# Patient Record
Sex: Female | Born: 1950
Health system: Southern US, Community
[De-identification: ages and names within clinical notes are randomized; demographics above are authoritative.]

## PROBLEM LIST (undated history)

## (undated) DIAGNOSIS — K219 Gastro-esophageal reflux disease without esophagitis: Secondary | ICD-10-CM

## (undated) DIAGNOSIS — K2100 Gastro-esophageal reflux disease with esophagitis, without bleeding: Secondary | ICD-10-CM

## (undated) DIAGNOSIS — M5416 Radiculopathy, lumbar region: Secondary | ICD-10-CM

## (undated) DIAGNOSIS — E785 Hyperlipidemia, unspecified: Secondary | ICD-10-CM

## (undated) DIAGNOSIS — R51 Headache: Secondary | ICD-10-CM

## (undated) DIAGNOSIS — R519 Headache, unspecified: Secondary | ICD-10-CM

## (undated) DIAGNOSIS — G5711 Meralgia paresthetica, right lower limb: Secondary | ICD-10-CM

## (undated) DIAGNOSIS — G250 Essential tremor: Secondary | ICD-10-CM

## (undated) HISTORY — PX: LIVER BIOPSY: SHX301

## (undated) HISTORY — PX: CHOLECYSTECTOMY: SHX55

## (undated) HISTORY — PX: ABDOMINAL HYSTERECTOMY: SHX81

---

## 1995-05-27 HISTORY — PX: BREAST BIOPSY: SHX20

## 2004-03-04 ENCOUNTER — Ambulatory Visit: Payer: Self-pay | Admitting: Internal Medicine

## 2004-07-25 ENCOUNTER — Ambulatory Visit: Payer: Self-pay | Admitting: Internal Medicine

## 2004-07-26 ENCOUNTER — Ambulatory Visit: Payer: Self-pay | Admitting: Internal Medicine

## 2004-08-05 ENCOUNTER — Ambulatory Visit: Payer: Self-pay | Admitting: Surgery

## 2005-03-25 ENCOUNTER — Ambulatory Visit: Payer: Self-pay | Admitting: Internal Medicine

## 2005-09-29 ENCOUNTER — Observation Stay: Payer: Self-pay | Admitting: Internal Medicine

## 2006-03-27 ENCOUNTER — Ambulatory Visit: Payer: Self-pay | Admitting: Internal Medicine

## 2007-05-26 ENCOUNTER — Ambulatory Visit: Payer: Self-pay | Admitting: Internal Medicine

## 2008-05-29 ENCOUNTER — Ambulatory Visit: Payer: Self-pay | Admitting: Internal Medicine

## 2009-06-07 ENCOUNTER — Ambulatory Visit: Payer: Self-pay | Admitting: Internal Medicine

## 2010-06-11 ENCOUNTER — Ambulatory Visit: Payer: Self-pay | Admitting: Internal Medicine

## 2011-06-17 ENCOUNTER — Ambulatory Visit: Payer: Self-pay | Admitting: Internal Medicine

## 2011-07-07 ENCOUNTER — Ambulatory Visit: Payer: Self-pay | Admitting: Gastroenterology

## 2012-06-17 ENCOUNTER — Ambulatory Visit: Payer: Self-pay | Admitting: Internal Medicine

## 2013-06-21 ENCOUNTER — Ambulatory Visit: Payer: Self-pay | Admitting: Internal Medicine

## 2014-07-26 ENCOUNTER — Ambulatory Visit
Admit: 2014-07-26 | Disposition: A | Discharge status: Home / Self Care | Payer: Self-pay | Attending: Internal Medicine | Admitting: Internal Medicine

## 2014-07-27 ENCOUNTER — Ambulatory Visit: Payer: Self-pay | Admitting: Internal Medicine

## 2014-09-05 ENCOUNTER — Ambulatory Visit
Admit: 2014-09-05 | Disposition: A | Discharge status: Home / Self Care | Payer: Self-pay | Attending: Physical Medicine and Rehabilitation | Admitting: Physical Medicine and Rehabilitation

## 2015-08-03 ENCOUNTER — Other Ambulatory Visit: Payer: Self-pay | Admitting: Internal Medicine

## 2015-08-03 DIAGNOSIS — Z1231 Encounter for screening mammogram for malignant neoplasm of breast: Secondary | ICD-10-CM

## 2015-08-14 ENCOUNTER — Ambulatory Visit
Admission: RE | Admit: 2015-08-14 | Discharge: 2015-08-14 | Disposition: A | Discharge status: Home / Self Care | Payer: BLUE CROSS/BLUE SHIELD | Source: Ambulatory Visit | Attending: Internal Medicine | Admitting: Internal Medicine

## 2015-08-14 DIAGNOSIS — Z1231 Encounter for screening mammogram for malignant neoplasm of breast: Secondary | ICD-10-CM

## 2016-07-17 DIAGNOSIS — Z79899 Other long term (current) drug therapy: Secondary | ICD-10-CM | POA: Diagnosis not present

## 2016-07-17 DIAGNOSIS — M5416 Radiculopathy, lumbar region: Secondary | ICD-10-CM | POA: Diagnosis not present

## 2016-07-24 DIAGNOSIS — M7061 Trochanteric bursitis, right hip: Secondary | ICD-10-CM | POA: Diagnosis not present

## 2016-07-24 DIAGNOSIS — Z0000 Encounter for general adult medical examination without abnormal findings: Secondary | ICD-10-CM | POA: Diagnosis not present

## 2016-08-05 ENCOUNTER — Other Ambulatory Visit: Payer: Self-pay | Admitting: Internal Medicine

## 2016-08-05 DIAGNOSIS — Z1231 Encounter for screening mammogram for malignant neoplasm of breast: Secondary | ICD-10-CM

## 2016-09-02 ENCOUNTER — Other Ambulatory Visit: Payer: Self-pay | Admitting: Internal Medicine

## 2016-09-02 ENCOUNTER — Ambulatory Visit
Admission: RE | Admit: 2016-09-02 | Discharge: 2016-09-02 | Disposition: A | Discharge status: Home / Self Care | Payer: PPO | Source: Ambulatory Visit | Attending: Internal Medicine | Admitting: Internal Medicine

## 2016-09-02 DIAGNOSIS — Z1231 Encounter for screening mammogram for malignant neoplasm of breast: Secondary | ICD-10-CM | POA: Diagnosis not present

## 2016-09-18 DIAGNOSIS — Z8371 Family history of colonic polyps: Secondary | ICD-10-CM | POA: Diagnosis not present

## 2016-09-18 DIAGNOSIS — K219 Gastro-esophageal reflux disease without esophagitis: Secondary | ICD-10-CM | POA: Diagnosis not present

## 2016-11-11 DIAGNOSIS — M5416 Radiculopathy, lumbar region: Secondary | ICD-10-CM | POA: Diagnosis not present

## 2016-11-11 DIAGNOSIS — M5136 Other intervertebral disc degeneration, lumbar region: Secondary | ICD-10-CM | POA: Diagnosis not present

## 2016-12-09 DIAGNOSIS — H25813 Combined forms of age-related cataract, bilateral: Secondary | ICD-10-CM | POA: Diagnosis not present

## 2016-12-12 DIAGNOSIS — L821 Other seborrheic keratosis: Secondary | ICD-10-CM | POA: Diagnosis not present

## 2016-12-26 DIAGNOSIS — M5416 Radiculopathy, lumbar region: Secondary | ICD-10-CM | POA: Diagnosis not present

## 2016-12-26 DIAGNOSIS — M5136 Other intervertebral disc degeneration, lumbar region: Secondary | ICD-10-CM | POA: Diagnosis not present

## 2017-01-08 DIAGNOSIS — L821 Other seborrheic keratosis: Secondary | ICD-10-CM | POA: Diagnosis not present

## 2017-01-16 DIAGNOSIS — H02839 Dermatochalasis of unspecified eye, unspecified eyelid: Secondary | ICD-10-CM | POA: Diagnosis not present

## 2017-01-16 DIAGNOSIS — H2513 Age-related nuclear cataract, bilateral: Secondary | ICD-10-CM | POA: Diagnosis not present

## 2017-01-16 DIAGNOSIS — H18413 Arcus senilis, bilateral: Secondary | ICD-10-CM | POA: Diagnosis not present

## 2017-02-11 DIAGNOSIS — H2511 Age-related nuclear cataract, right eye: Secondary | ICD-10-CM | POA: Diagnosis not present

## 2017-02-11 DIAGNOSIS — H2513 Age-related nuclear cataract, bilateral: Secondary | ICD-10-CM | POA: Diagnosis not present

## 2017-02-17 ENCOUNTER — Encounter: Payer: Self-pay | Admitting: *Deleted

## 2017-02-17 DIAGNOSIS — Z23 Encounter for immunization: Secondary | ICD-10-CM | POA: Diagnosis not present

## 2017-02-17 DIAGNOSIS — Z0000 Encounter for general adult medical examination without abnormal findings: Secondary | ICD-10-CM | POA: Diagnosis not present

## 2017-02-18 ENCOUNTER — Ambulatory Visit: Payer: PPO | Admitting: Anesthesiology

## 2017-02-18 ENCOUNTER — Encounter
Admission: RE | Disposition: A | Discharge status: Home / Self Care | Payer: Self-pay | Source: Ambulatory Visit | Attending: Internal Medicine

## 2017-02-18 ENCOUNTER — Ambulatory Visit
Admission: RE | Admit: 2017-02-18 | Discharge: 2017-02-18 | Disposition: A | Discharge status: Home / Self Care | Payer: PPO | Source: Ambulatory Visit | Attending: Internal Medicine | Admitting: Internal Medicine

## 2017-02-18 DIAGNOSIS — G250 Essential tremor: Secondary | ICD-10-CM | POA: Insufficient documentation

## 2017-02-18 DIAGNOSIS — Z8371 Family history of colonic polyps: Secondary | ICD-10-CM | POA: Diagnosis not present

## 2017-02-18 DIAGNOSIS — E785 Hyperlipidemia, unspecified: Secondary | ICD-10-CM | POA: Diagnosis not present

## 2017-02-18 DIAGNOSIS — K219 Gastro-esophageal reflux disease without esophagitis: Secondary | ICD-10-CM | POA: Diagnosis not present

## 2017-02-18 DIAGNOSIS — K648 Other hemorrhoids: Secondary | ICD-10-CM | POA: Diagnosis not present

## 2017-02-18 DIAGNOSIS — K640 First degree hemorrhoids: Secondary | ICD-10-CM | POA: Diagnosis not present

## 2017-02-18 DIAGNOSIS — Z1211 Encounter for screening for malignant neoplasm of colon: Secondary | ICD-10-CM | POA: Diagnosis not present

## 2017-02-18 DIAGNOSIS — Z79899 Other long term (current) drug therapy: Secondary | ICD-10-CM | POA: Insufficient documentation

## 2017-02-18 HISTORY — DX: Gastro-esophageal reflux disease without esophagitis: K21.9

## 2017-02-18 HISTORY — DX: Headache: R51

## 2017-02-18 HISTORY — PX: COLONOSCOPY WITH PROPOFOL: SHX5780

## 2017-02-18 HISTORY — DX: Headache, unspecified: R51.9

## 2017-02-18 HISTORY — DX: Hyperlipidemia, unspecified: E78.5

## 2017-02-18 HISTORY — DX: Gastro-esophageal reflux disease with esophagitis, without bleeding: K21.00

## 2017-02-18 HISTORY — DX: Radiculopathy, lumbar region: M54.16

## 2017-02-18 HISTORY — DX: Essential tremor: G25.0

## 2017-02-18 HISTORY — DX: Gastro-esophageal reflux disease with esophagitis: K21.0

## 2017-02-18 HISTORY — DX: Meralgia paresthetica, right lower limb: G57.11

## 2017-02-18 SURGERY — COLONOSCOPY WITH PROPOFOL
Anesthesia: General

## 2017-02-18 MED ORDER — MIDAZOLAM HCL 2 MG/2ML IJ SOLN
INTRAMUSCULAR | Status: AC
  Filled 2017-02-18: qty 2

## 2017-02-18 MED ORDER — FENTANYL CITRATE (PF) 100 MCG/2ML IJ SOLN
INTRAMUSCULAR | Status: AC
  Filled 2017-02-18: qty 2

## 2017-02-18 MED ORDER — FENTANYL CITRATE (PF) 100 MCG/2ML IJ SOLN
INTRAMUSCULAR | Status: DC | PRN
  Administered 2017-02-18 (×2): 25 ug via INTRAVENOUS
  Administered 2017-02-18: 50 ug via INTRAVENOUS

## 2017-02-18 MED ORDER — SODIUM CHLORIDE 0.9 % IV SOLN
INTRAVENOUS | Status: DC
  Administered 2017-02-18: 10:00:00 via INTRAVENOUS

## 2017-02-18 MED ORDER — MIDAZOLAM HCL 2 MG/2ML IJ SOLN
INTRAMUSCULAR | Status: DC | PRN
  Administered 2017-02-18 (×2): 1 mg via INTRAVENOUS

## 2017-02-18 MED ORDER — EPHEDRINE SULFATE 50 MG/ML IJ SOLN
INTRAMUSCULAR | Status: DC | PRN
  Administered 2017-02-18: 10 mg via INTRAVENOUS
  Administered 2017-02-18 (×2): 5 mg via INTRAVENOUS

## 2017-02-18 MED ORDER — PROPOFOL 500 MG/50ML IV EMUL
INTRAVENOUS | Status: AC
  Filled 2017-02-18: qty 50

## 2017-02-18 MED ORDER — EPHEDRINE SULFATE 50 MG/ML IJ SOLN
INTRAMUSCULAR | Status: AC
  Filled 2017-02-18: qty 1

## 2017-02-18 MED ORDER — PROPOFOL 500 MG/50ML IV EMUL
INTRAVENOUS | Status: DC | PRN
  Administered 2017-02-18: 120 ug/kg/min via INTRAVENOUS

## 2017-02-18 NOTE — Anesthesia Preprocedure Evaluation (Signed)
Anesthesia Evaluation  Patient identified by MRN, date of birth, ID band Patient awake    Reviewed: Allergy & Precautions, NPO status , Patient's Chart, lab work & pertinent test results  History of Anesthesia Complications Negative for: history of anesthetic complications  Airway Mallampati: II       Dental   Pulmonary neg sleep apnea, neg COPD,           Cardiovascular (-) hypertension(-) Past MI and (-) CHF (-) dysrhythmias (-) Valvular Problems/Murmurs     Neuro/Psych Essential tremors    GI/Hepatic Neg liver ROS, GERD  ,  Endo/Other  neg diabetes  Renal/GU negative Renal ROS     Musculoskeletal   Abdominal   Peds  Hematology   Anesthesia Other Findings   Reproductive/Obstetrics                             Anesthesia Physical Anesthesia Plan  ASA: II  Anesthesia Plan: General   Post-op Pain Management:    Induction:   PONV Risk Score and Plan: Propofol infusion  Airway Management Planned:   Additional Equipment:   Intra-op Plan:   Post-operative Plan:   Informed Consent: I have reviewed the patients History and Physical, chart, labs and discussed the procedure including the risks, benefits and alternatives for the proposed anesthesia with the patient or authorized representative who has indicated his/her understanding and acceptance.     Plan Discussed with:   Anesthesia Plan Comments:         Anesthesia Quick Evaluation

## 2017-02-18 NOTE — Transfer of Care (Signed)
Immediate Anesthesia Transfer of Care Note  Patient: Mary Hudson  Procedure(s) Performed: Procedure(s): COLONOSCOPY WITH PROPOFOL (N/A)  Patient Location: PACU  Anesthesia Type:General  Level of Consciousness: awake and sedated  Airway & Oxygen Therapy: Patient Spontanous Breathing and Patient connected to nasal cannula oxygen  Post-op Assessment: Report given to RN and Post -op Vital signs reviewed and stable  Post vital signs: Reviewed and stable  Last Vitals:  Vitals:   02/18/17 0919  BP: 118/72  Pulse: 78  Resp: 20  Temp: (!) 35.7 C  SpO2: 100%    Last Pain:  Vitals:   02/18/17 0919  TempSrc: Tympanic         Complications: No apparent anesthesia complications

## 2017-02-18 NOTE — Anesthesia Post-op Follow-up Note (Signed)
Anesthesia QCDR form completed.        

## 2017-02-18 NOTE — Op Note (Signed)
[facility] Gastroenterology Patient Name: Mary Hudson Procedure Date: 02/18/2017 9:49 AM MRN: [id number] Account #: [id number] Date of Birth: 05/29/1950 Admit Type: Outpatient Age: 66 Room: [facility] ENDO ROOM 1 Gender: Female Note Status: Finalized Procedure:            Colonoscopy Indications:          Colon cancer screening in patient at increased risk:                        Family history of 1st-degree relative with colon polyps Providers:            Benay Pike. Alice Reichert MD, MD Referring MD:         Rusty Aus, MD (Referring MD) Medicines:            Propofol per Anesthesia Complications:        No immediate complications. Procedure:            Pre-Anesthesia Assessment:                       - ASA Grade Assessment: III - A patient with severe                        systemic disease.                       - After reviewing the risks and benefits, the patient                        was deemed in satisfactory condition to undergo the                        procedure.                       After obtaining informed consent, the colonoscope was                        passed under direct vision. Throughout the procedure,                        the patient's blood pressure, pulse, and oxygen                        saturations were monitored continuously. The Olympus                        CF-H180AL colonoscope ( S#: [id number] ) was introduced                        through the anus and advanced to the the cecum,                        identified by appendiceal orifice and ileocecal valve.                        The ileocecal valve, appendiceal orifice, and rectum                        were photographed. The entire colon was well  visualized. The colonoscopy was somewhat difficult due                        to a tortuous colon. Successful completion of the                        procedure was aided by applying abdominal pressure. The             patient tolerated the procedure well. The quality of                        the bowel preparation was good. Findings:      The perianal and digital rectal examinations were normal.      Normal mucosa was found in the entire colon.      Non-bleeding internal hemorrhoids were found during retroflexion. The       hemorrhoids were Grade I (internal hemorrhoids that do not prolapse). Impression:           - Normal mucosa in the entire examined colon.                       - Non-bleeding internal hemorrhoids.                       - No specimens collected. Recommendation:       - Repeat colonoscopy in 5 years for screening purposes.                       - Return to GI office PRN.                       - Patient has a contact number available for                        emergencies. The signs and symptoms of potential                        delayed complications were discussed with the patient.                        Return to normal activities tomorrow. Written discharge                        instructions were provided to the patient.                       - Resume previous diet.                       - Continue present medications.                       - Discharge patient to home (with spouse).                       - The findings and recommendations were discussed with                        the patient.                       - The findings and recommendations  were discussed with                        the patient's family. Procedure Code(s):    --- Professional ---                       L9767, Colorectal cancer screening; colonoscopy on                        individual at high risk Diagnosis Code(s):    --- Professional ---                       K64.0, First degree hemorrhoids                       Z83.71, Family history of colonic polyps CPT copyright 2016 American Medical Association. All rights reserved. The codes documented in this report are preliminary and upon coder  review may  be revised to meet current compliance requirements. Efrain Sella MD, MD 02/18/2017 10:32:34 AM This report has been signed electronically. Number of Addenda: 0 Note Initiated On: 02/18/2017 9:49 AM Scope Withdrawal Time: 0 hours 8 minutes 13 seconds  Total Procedure Duration: 0 hours 19 minutes 33 seconds       [facility]

## 2017-02-18 NOTE — Anesthesia Procedure Notes (Signed)
Performed by: COOK-MARTIN, Akyra Bouchie Pre-anesthesia Checklist: Patient identified, Emergency Drugs available, Suction available, Patient being monitored and Timeout performed Patient Re-evaluated:Patient Re-evaluated prior to induction Oxygen Delivery Method: Nasal cannula Preoxygenation: Pre-oxygenation with 100% oxygen Induction Type: IV induction Placement Confirmation: positive ETCO2 and CO2 detector       

## 2017-02-18 NOTE — H&P (Signed)
Outpatient short stay form Pre-procedure 02/18/2017 10:02 AM Mary Hudson K. Alice Reichert, M.D.  Primary Physician: Jillyn Ledger, M.D.  Reason for visit:  Colon cancer screening, high risk. Last colonoscopy 07/07/2011 - Redundant colon, colonic diverticulosis  History of present illness:  Patient is a pleasant 66 yo wf presenting for colon cancer screening. She denies abdominal pain, weight loss or rectal bleeding.     Current Facility-Administered Medications:  .  0.9 %  sodium chloride infusion, , Intravenous, Continuous, [place], Benay Pike, MD, Last Rate: 20 mL/hr at 02/18/17 5102  Prescriptions Prior to Admission  Medication Sig Dispense Refill Last Dose  . citalopram (CELEXA) 20 MG tablet Take 20 mg by mouth daily.   02/17/2017 at Unknown time  . etodolac (LODINE) 400 MG tablet Take 400 mg by mouth 2 (two) times daily. As needed   Past Month at Unknown time  . fluticasone (FLONASE) 50 MCG/ACT nasal spray Place 2 sprays into both nostrils daily.   02/17/2017 at Unknown time  . propranolol (INDERAL) 20 MG tablet Take 20 mg by mouth 2 (two) times daily.   02/17/2017 at Unknown time  . sucralfate (CARAFATE) 1 g tablet Take 1 g by mouth 4 (four) times daily.   Past Month at Unknown time  . topiramate (TOPAMAX) 100 MG tablet Take 100 mg by mouth daily.   Past Month at Unknown time     Allergies  Allergen Reactions  . Celebrex [Celecoxib]   . Flagyl [Metronidazole]   . Mirtazapine   . Sulfa Antibiotics      Past Medical History:  Diagnosis Date  . Chronic reflux esophagitis   . Essential tremor   . GERD (gastroesophageal reflux disease)   . Headache    Migraine with aura and without status migrainosus, not intractable  . Hyperlipidemia   . Lumbar radiculitis   . Meralgia paraesthetica, right     Review of systems:      Physical Exam  Gen: NAD. Appears comfortable.  HEENT: [place]/AT. PERRLA. Normal external ear exam.  Chest: CTA, no wheezes.  CV: RR nl S1, S2. No gallops.  Abd:  soft, nt, nd. BS+  Ext: no edema. Pulses 2+  Neuro: Alert and oriented. Judgement appears normal. Nonfocal.    Planned procedures: Proceed with colonoscopy. The patient understands the nature of the planned procedure, indications, risks, alternatives and potential complications including but not limited to bleeding, infection, perforation, damage to internal organs and possible oversedation/side effects from anesthesia. The patient agrees and gives consent to proceed.  Please refer to procedure notes for findings, recommendations and patient disposition/instructions.    Mary Hudson K. Alice Reichert, M.D. Gastroenterology 02/18/2017  10:02 AM

## 2017-02-18 NOTE — Anesthesia Postprocedure Evaluation (Signed)
Anesthesia Post Note  Patient: Mary Hudson  Procedure(s) Performed: Procedure(s) (LRB): COLONOSCOPY WITH PROPOFOL (N/A)  Patient location during evaluation: Endoscopy Anesthesia Type: General Level of consciousness: awake and alert Pain management: pain level controlled Vital Signs Assessment: post-procedure vital signs reviewed and stable Respiratory status: spontaneous breathing and respiratory function stable Cardiovascular status: stable Anesthetic complications: no     Last Vitals:  Vitals:   02/18/17 1051 02/18/17 1101  BP: 103/70   Pulse: 74 70  Resp: 19 (!) 22  Temp:    SpO2: 96% 96%    Last Pain:  Vitals:   02/18/17 1031  TempSrc: Tympanic                 Rashidah [place] K

## 2017-02-19 ENCOUNTER — Encounter: Payer: Self-pay | Admitting: Internal Medicine

## 2017-02-24 DIAGNOSIS — M5416 Radiculopathy, lumbar region: Secondary | ICD-10-CM | POA: Diagnosis not present

## 2017-02-24 DIAGNOSIS — M5136 Other intervertebral disc degeneration, lumbar region: Secondary | ICD-10-CM | POA: Diagnosis not present

## 2017-03-16 DIAGNOSIS — H2511 Age-related nuclear cataract, right eye: Secondary | ICD-10-CM | POA: Diagnosis not present

## 2017-03-17 DIAGNOSIS — H2512 Age-related nuclear cataract, left eye: Secondary | ICD-10-CM | POA: Diagnosis not present

## 2017-03-30 DIAGNOSIS — M5416 Radiculopathy, lumbar region: Secondary | ICD-10-CM | POA: Diagnosis not present

## 2017-03-30 DIAGNOSIS — M5136 Other intervertebral disc degeneration, lumbar region: Secondary | ICD-10-CM | POA: Diagnosis not present

## 2017-04-06 DIAGNOSIS — H2512 Age-related nuclear cataract, left eye: Secondary | ICD-10-CM | POA: Diagnosis not present

## 2017-04-13 DIAGNOSIS — Z961 Presence of intraocular lens: Secondary | ICD-10-CM | POA: Diagnosis not present

## 2017-05-01 IMAGING — MG MM DIGITAL SCREENING BILAT W/ TOMO W/ CAD
8 of 13 series · 8 of 29 positions shown · non-contrast
Comparison: Previous exam(s).

CLINICAL DATA: Screening.

EXAM:
2D DIGITAL SCREENING BILATERAL MAMMOGRAM WITH CAD AND ADJUNCT TOMO

[L MLO (1 of 2)]
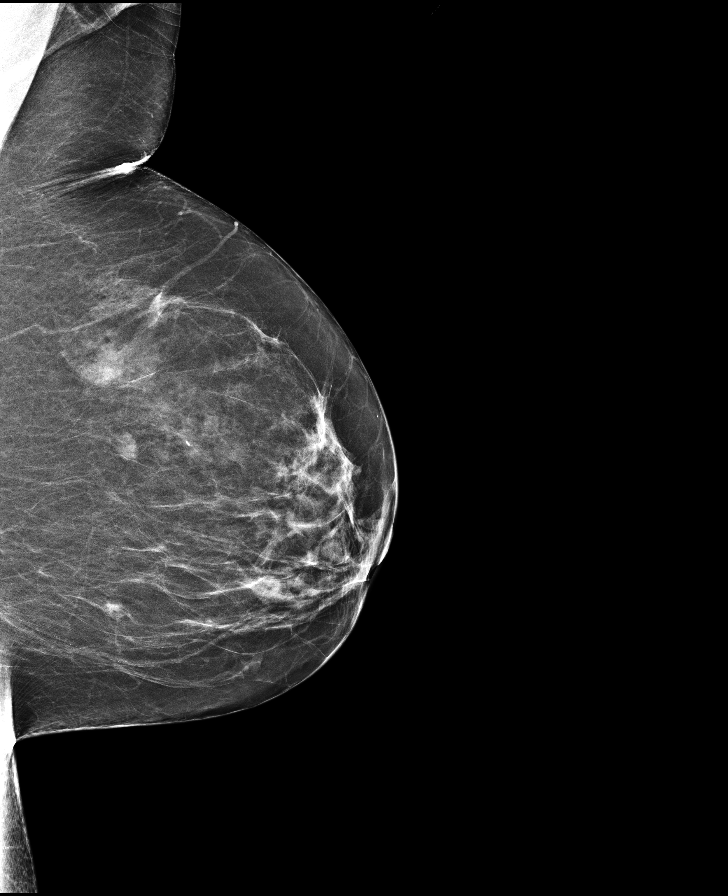

[L CC synth-2D]
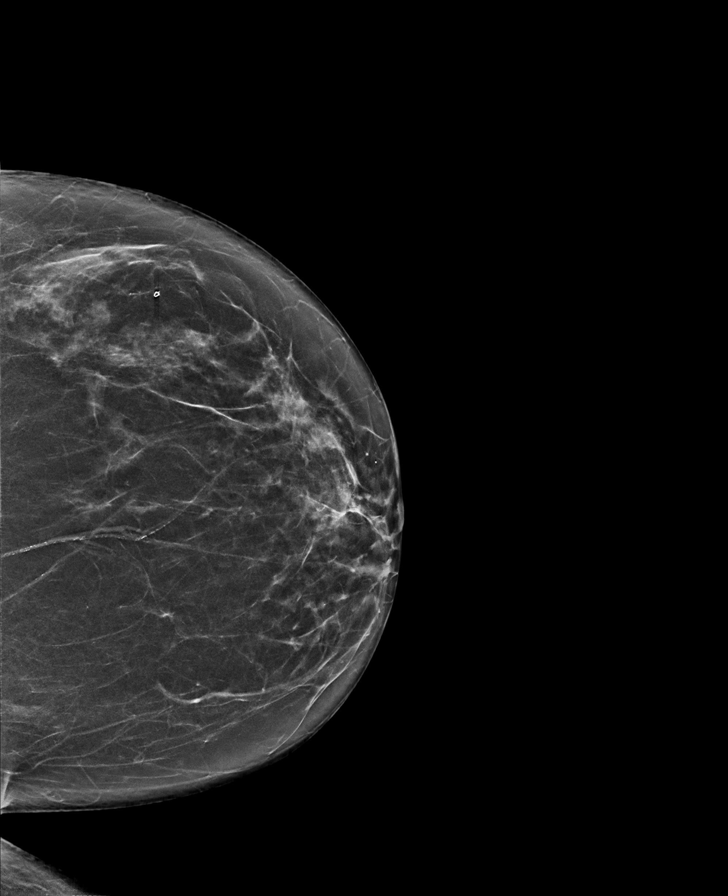

[L MLO synth-2D]
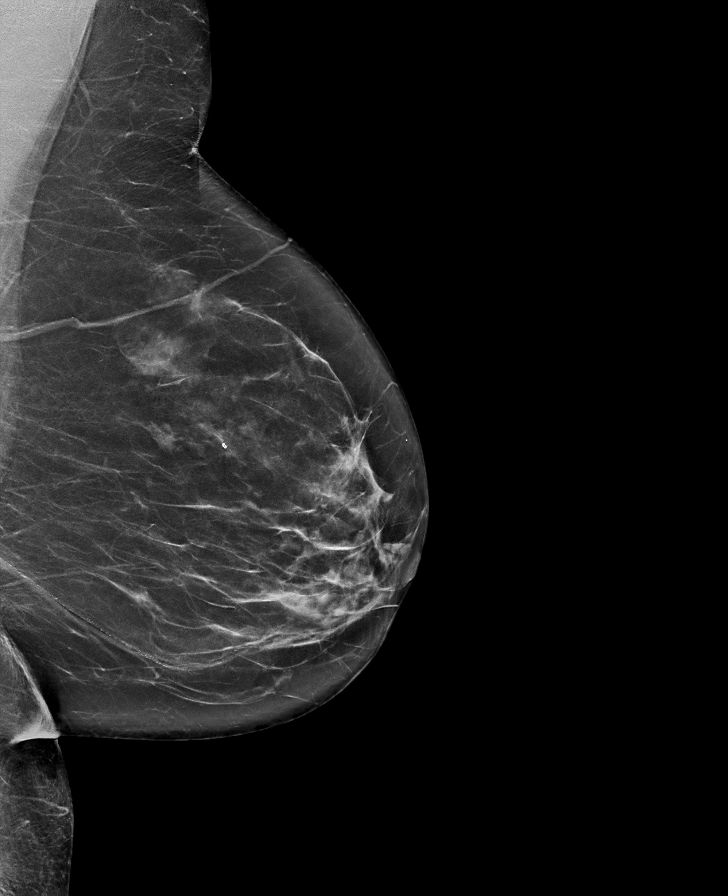

[R MLO]
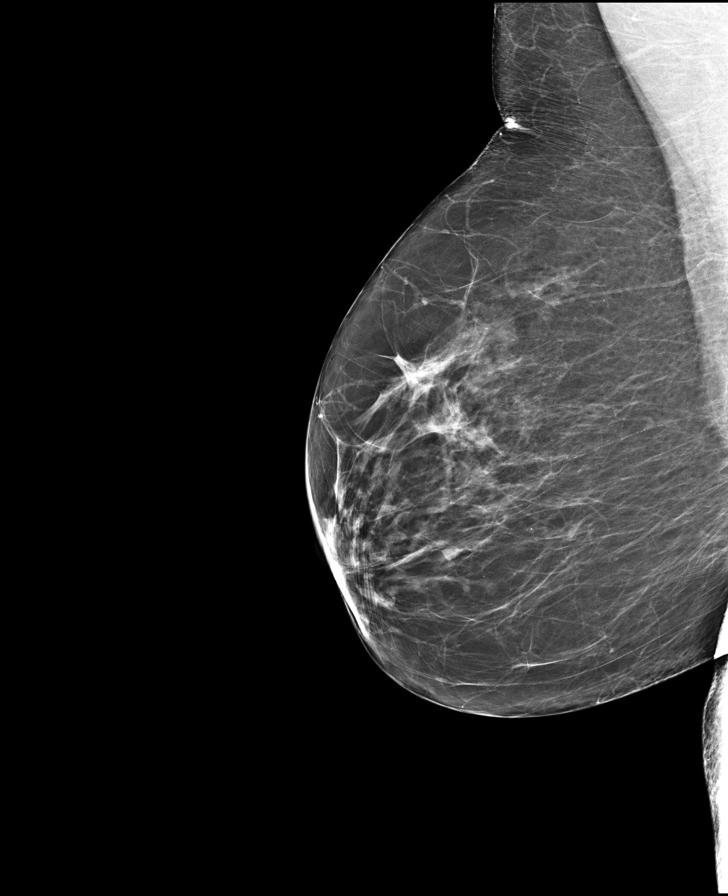

[L MLO (2 of 2)]
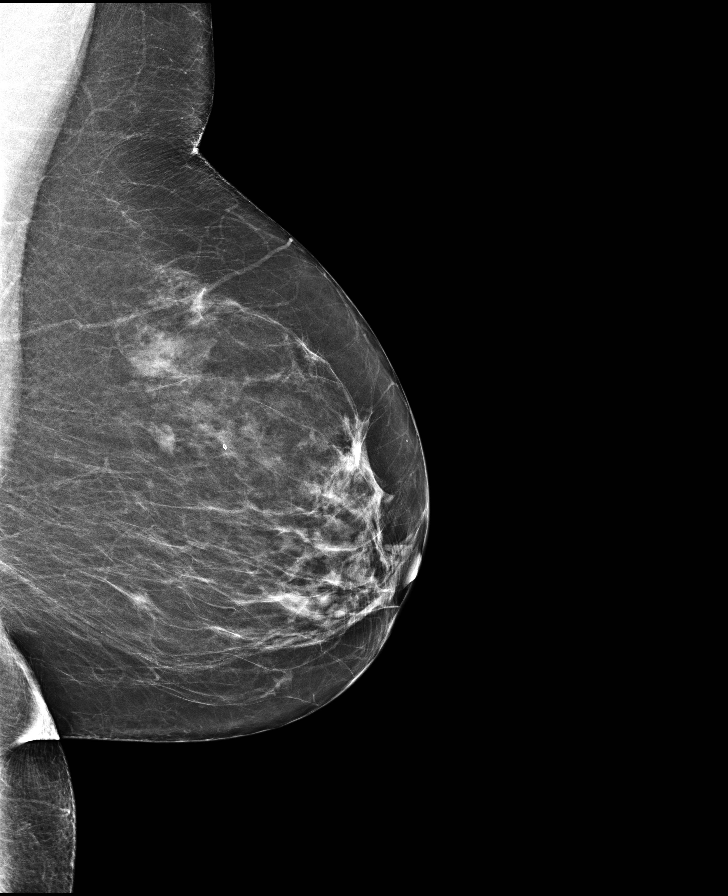

[R MLO synth-2D]
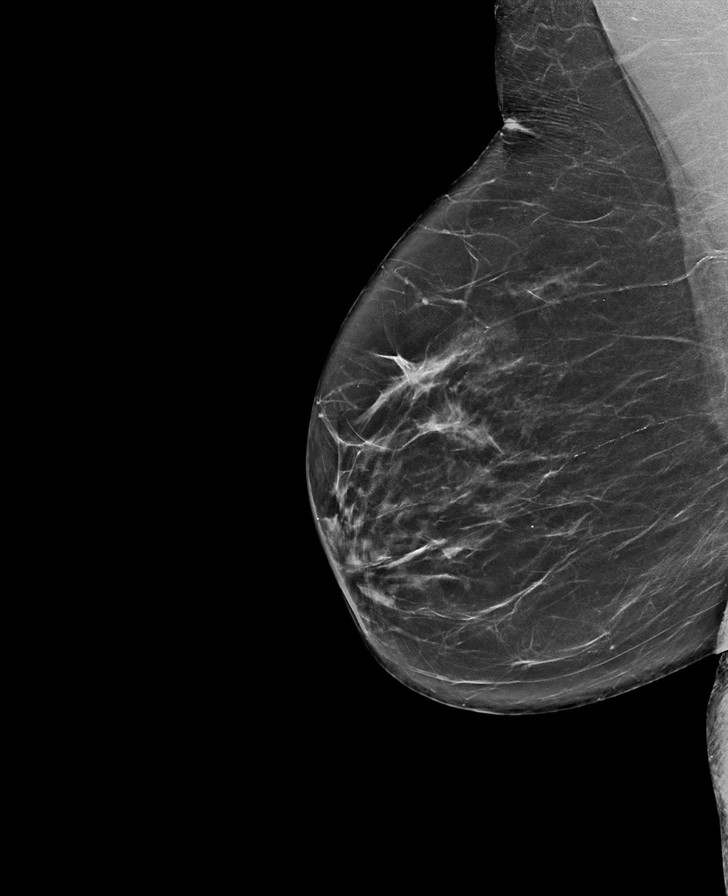

[L CC]
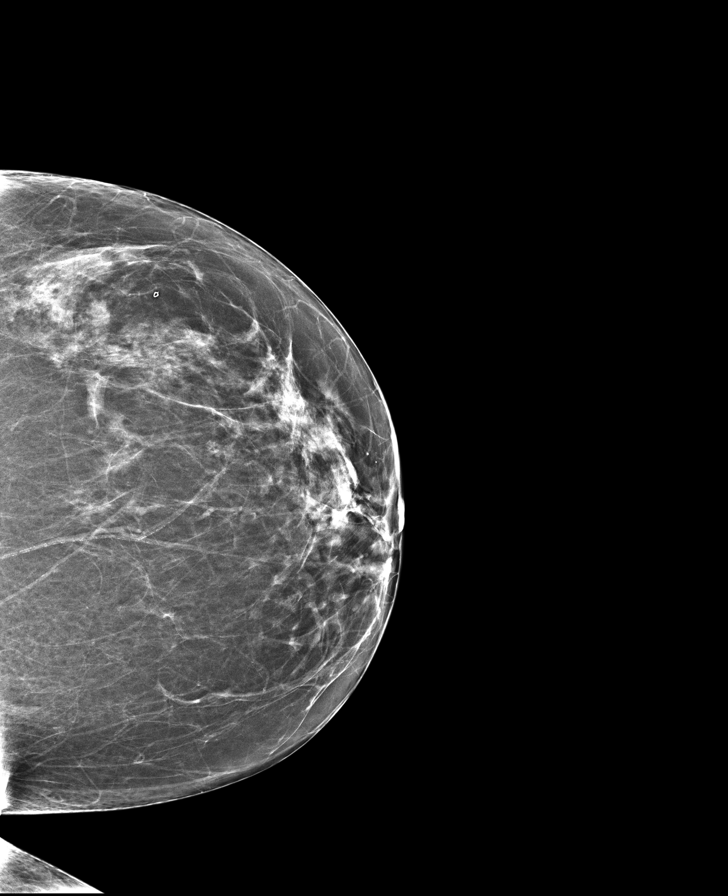

[R CC]
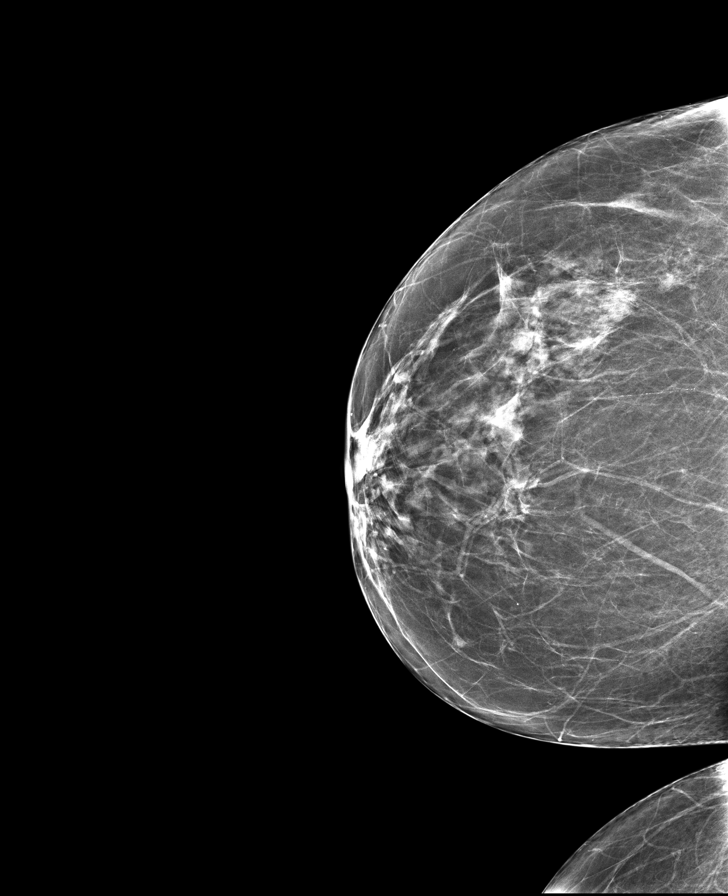

[8 of 29 positions shown; findings below may reference images not displayed]

ACR Breast Density Category b: There are scattered areas of
fibroglandular density.
FINDINGS: There are no findings suspicious for malignancy. Images were
processed with CAD.
IMPRESSION: No mammographic evidence of malignancy. A result letter of this
screening mammogram will be mailed directly to the patient.

RECOMMENDATION:
Screening mammogram in one year. (Code:97-6-RS4)

BI-RADS CATEGORY  1: Negative.

## 2017-07-21 DIAGNOSIS — Z0000 Encounter for general adult medical examination without abnormal findings: Secondary | ICD-10-CM | POA: Diagnosis not present

## 2017-07-28 DIAGNOSIS — Z79899 Other long term (current) drug therapy: Secondary | ICD-10-CM | POA: Diagnosis not present

## 2017-07-28 DIAGNOSIS — E782 Mixed hyperlipidemia: Secondary | ICD-10-CM | POA: Diagnosis not present

## 2017-07-28 DIAGNOSIS — Z0000 Encounter for general adult medical examination without abnormal findings: Secondary | ICD-10-CM | POA: Diagnosis not present

## 2017-08-20 ENCOUNTER — Other Ambulatory Visit: Payer: Self-pay | Admitting: Internal Medicine

## 2017-08-20 DIAGNOSIS — Z1231 Encounter for screening mammogram for malignant neoplasm of breast: Secondary | ICD-10-CM

## 2017-09-07 DIAGNOSIS — J019 Acute sinusitis, unspecified: Secondary | ICD-10-CM | POA: Diagnosis not present

## 2017-09-09 ENCOUNTER — Ambulatory Visit
Admission: RE | Admit: 2017-09-09 | Discharge: 2017-09-09 | Disposition: A | Discharge status: Home / Self Care | Payer: PPO | Source: Ambulatory Visit | Attending: Internal Medicine | Admitting: Internal Medicine

## 2017-09-09 DIAGNOSIS — Z1231 Encounter for screening mammogram for malignant neoplasm of breast: Secondary | ICD-10-CM | POA: Insufficient documentation

## 2017-10-21 DIAGNOSIS — H1045 Other chronic allergic conjunctivitis: Secondary | ICD-10-CM | POA: Diagnosis not present

## 2018-01-14 DIAGNOSIS — R300 Dysuria: Secondary | ICD-10-CM | POA: Diagnosis not present

## 2018-03-16 DIAGNOSIS — G5601 Carpal tunnel syndrome, right upper limb: Secondary | ICD-10-CM | POA: Diagnosis not present

## 2018-03-29 DIAGNOSIS — G5601 Carpal tunnel syndrome, right upper limb: Secondary | ICD-10-CM | POA: Diagnosis not present

## 2018-04-08 ENCOUNTER — Encounter
Admission: RE | Admit: 2018-04-08 | Discharge: 2018-04-08 | Disposition: A | Discharge status: Home / Self Care | Payer: PPO | Source: Ambulatory Visit | Attending: Surgery | Admitting: Surgery

## 2018-04-08 ENCOUNTER — Other Ambulatory Visit: Payer: Self-pay

## 2018-04-08 DIAGNOSIS — Z01810 Encounter for preprocedural cardiovascular examination: Secondary | ICD-10-CM | POA: Diagnosis not present

## 2018-04-08 DIAGNOSIS — R001 Bradycardia, unspecified: Secondary | ICD-10-CM | POA: Insufficient documentation

## 2018-04-08 NOTE — Patient Instructions (Signed)
Your procedure is scheduled on: 04/15/18 Report to Day Surgery.[facility] SECOND FLOOR To find out your arrival time please call [phone] between 1PM - 3PM on 04/14/18 Remember: Instructions that are not followed completely may result in serious medical risk,  up to and including death, or upon the discretion of your surgeon and anesthesiologist your  surgery may need to be rescheduled.     _X__ 1. Do not eat food after midnight the night before your procedure.                 No gum chewing or hard candies. You may drink clear liquids up to 2 hours                 before you are scheduled to arrive for your surgery- DO not drink clear                 liquids within 2 hours of the start of your surgery.                 Clear Liquids include:  water, apple juice without pulp, clear carbohydrate                 drink such as Clearfast of Gatorade, Black Coffee or Tea (Do not add                 anything to coffee or tea).  __X__2.  On the morning of surgery brush your teeth with toothpaste and water, you                may rinse your mouth with mouthwash if you wish.  Do not swallow any toothpaste of mouthwash.     _X__ 3.  No Alcohol for 24 hours before or after surgery.   _X__ 4.  Do Not Smoke or use e-cigarettes For 24 Hours Prior to Your Surgery.                 Do not use any chewable tobacco products for at least 6 hours prior to                 surgery.  ____  5.  Bring all medications with you on the day of surgery if instructed.   _X___  6.  Notify your doctor if there is any change in your medical condition      (cold, fever, infections).     Do not wear jewelry, make-up, hairpins, clips or nail polish. Do not wear lotions, powders, or perfumes. You may wear deodorant. Do not shave 48 hours prior to surgery. Men may shave face and neck. Do not bring valuables to the hospital.    [facility] is not responsible for any belongings or  valuables.  Contacts, dentures or bridgework may not be worn into surgery. Leave your suitcase in the car. After surgery it may be brought to your room. For patients admitted to the hospital, discharge time is determined by your treatment team.   Patients discharged the day of surgery will not be allowed to drive home.   Please read over the following fact sheets that you were given:   Surgical Site Infection Prevention   __X__ Take these medicines the morning of surgery with A SIP OF WATER:    1. CITALOPRAM  2. PROPRANOLOL  3.   4.  5.  6.  ____ Fleet Enema (as directed)   _X___ Use CHG Soap as directed  ____ Use  inhalers on the day of surgery  ____ Stop metformin 2 days prior to surgery    ____ Take 1/2 of usual insulin dose the night before surgery. No insulin the morning          of surgery.   ____ Stop Coumadin/Plavix/aspirin on  __X__ Stop Anti-inflammatories on     STOP ETOLODAC NOW UNTIL AFTER SURGERY  __X__ Stop supplements until after surgery.   STOP VIT E UNTIL AFTER SURGERY  ____ Bring C-Pap to the hospital.

## 2018-04-15 ENCOUNTER — Encounter
Admission: RE | Disposition: A | Discharge status: Home / Self Care | Payer: Self-pay | Source: Ambulatory Visit | Attending: Surgery

## 2018-04-15 ENCOUNTER — Ambulatory Visit
Admission: RE | Admit: 2018-04-15 | Discharge: 2018-04-15 | Disposition: A | Discharge status: Home / Self Care | Payer: PPO | Source: Ambulatory Visit | Attending: Surgery | Admitting: Surgery

## 2018-04-15 ENCOUNTER — Ambulatory Visit: Payer: PPO | Admitting: Anesthesiology

## 2018-04-15 ENCOUNTER — Other Ambulatory Visit: Payer: Self-pay

## 2018-04-15 DIAGNOSIS — G5601 Carpal tunnel syndrome, right upper limb: Secondary | ICD-10-CM | POA: Insufficient documentation

## 2018-04-15 DIAGNOSIS — F329 Major depressive disorder, single episode, unspecified: Secondary | ICD-10-CM | POA: Insufficient documentation

## 2018-04-15 DIAGNOSIS — G4730 Sleep apnea, unspecified: Secondary | ICD-10-CM | POA: Insufficient documentation

## 2018-04-15 DIAGNOSIS — Z79899 Other long term (current) drug therapy: Secondary | ICD-10-CM | POA: Insufficient documentation

## 2018-04-15 DIAGNOSIS — G43909 Migraine, unspecified, not intractable, without status migrainosus: Secondary | ICD-10-CM | POA: Diagnosis not present

## 2018-04-15 DIAGNOSIS — Z791 Long term (current) use of non-steroidal anti-inflammatories (NSAID): Secondary | ICD-10-CM | POA: Insufficient documentation

## 2018-04-15 DIAGNOSIS — Z7951 Long term (current) use of inhaled steroids: Secondary | ICD-10-CM | POA: Insufficient documentation

## 2018-04-15 HISTORY — PX: CARPAL TUNNEL RELEASE: SHX101

## 2018-04-15 SURGERY — RELEASE, CARPAL TUNNEL, ENDOSCOPIC
Anesthesia: General | Site: Hand | Laterality: Right

## 2018-04-15 MED ORDER — LACTATED RINGERS IV SOLN
INTRAVENOUS | Status: DC | PRN
  Administered 2018-04-15: 07:00:00 via INTRAVENOUS

## 2018-04-15 MED ORDER — PHENYLEPHRINE HCL 10 MG/ML IJ SOLN
INTRAMUSCULAR | Status: DC | PRN
  Administered 2018-04-15: 50 ug via INTRAVENOUS
  Administered 2018-04-15: 100 ug via INTRAVENOUS

## 2018-04-15 MED ORDER — POTASSIUM CHLORIDE IN NACL 20-0.9 MEQ/L-% IV SOLN: INTRAVENOUS | Status: DC

## 2018-04-15 MED ORDER — BUPIVACAINE HCL (PF) 0.5 % IJ SOLN
INTRAMUSCULAR | Status: AC
  Filled 2018-04-15: qty 30

## 2018-04-15 MED ORDER — MIDAZOLAM HCL 2 MG/2ML IJ SOLN
INTRAMUSCULAR | Status: DC | PRN
  Administered 2018-04-15: 2 mg via INTRAVENOUS

## 2018-04-15 MED ORDER — FENTANYL CITRATE (PF) 100 MCG/2ML IJ SOLN
INTRAMUSCULAR | Status: AC
  Filled 2018-04-15: qty 2

## 2018-04-15 MED ORDER — PROPOFOL 10 MG/ML IV BOLUS
INTRAVENOUS | Status: AC
  Filled 2018-04-15: qty 20

## 2018-04-15 MED ORDER — FAMOTIDINE 20 MG PO TABS
ORAL_TABLET | ORAL | Status: AC
  Filled 2018-04-15: qty 1

## 2018-04-15 MED ORDER — DEXAMETHASONE SODIUM PHOSPHATE 10 MG/ML IJ SOLN
INTRAMUSCULAR | Status: DC | PRN
  Administered 2018-04-15: 5 mg via INTRAVENOUS

## 2018-04-15 MED ORDER — MIDAZOLAM HCL 2 MG/2ML IJ SOLN
INTRAMUSCULAR | Status: AC
  Filled 2018-04-15: qty 2

## 2018-04-15 MED ORDER — PROPOFOL 10 MG/ML IV BOLUS
INTRAVENOUS | Status: DC | PRN
  Administered 2018-04-15: 150 mg via INTRAVENOUS

## 2018-04-15 MED ORDER — HYDROCODONE-ACETAMINOPHEN 5-325 MG PO TABS: 1.0000 | ORAL_TABLET | ORAL | Status: DC | PRN

## 2018-04-15 MED ORDER — ONDANSETRON HCL 4 MG/2ML IJ SOLN
INTRAMUSCULAR | Status: DC | PRN
  Administered 2018-04-15: 4 mg via INTRAVENOUS

## 2018-04-15 MED ORDER — METOCLOPRAMIDE HCL 10 MG PO TABS: 5.0000 mg | ORAL_TABLET | Freq: Three times a day (TID) | ORAL | Status: DC | PRN

## 2018-04-15 MED ORDER — FAMOTIDINE 20 MG PO TABS
20.0000 mg | ORAL_TABLET | Freq: Once | ORAL | Status: AC
  Administered 2018-04-15: 20 mg via ORAL

## 2018-04-15 MED ORDER — LACTATED RINGERS IV SOLN
INTRAVENOUS | Status: DC
  Administered 2018-04-15: 07:00:00 via INTRAVENOUS

## 2018-04-15 MED ORDER — FENTANYL CITRATE (PF) 100 MCG/2ML IJ SOLN
INTRAMUSCULAR | Status: DC | PRN
  Administered 2018-04-15: 50 ug via INTRAVENOUS
  Administered 2018-04-15 (×2): 25 ug via INTRAVENOUS

## 2018-04-15 MED ORDER — METOCLOPRAMIDE HCL 5 MG/ML IJ SOLN: 5.0000 mg | Freq: Three times a day (TID) | INTRAMUSCULAR | Status: DC | PRN

## 2018-04-15 MED ORDER — LIDOCAINE HCL (CARDIAC) PF 100 MG/5ML IV SOSY
PREFILLED_SYRINGE | INTRAVENOUS | Status: DC | PRN
  Administered 2018-04-15: 100 mg via INTRAVENOUS

## 2018-04-15 MED ORDER — ONDANSETRON HCL 4 MG PO TABS: 4.0000 mg | ORAL_TABLET | Freq: Four times a day (QID) | ORAL | Status: DC | PRN

## 2018-04-15 MED ORDER — CEFAZOLIN SODIUM-DEXTROSE 2-3 GM-%(50ML) IV SOLR
INTRAVENOUS | Status: DC | PRN
  Administered 2018-04-15: 2 g via INTRAVENOUS

## 2018-04-15 MED ORDER — HYDROCODONE-ACETAMINOPHEN 5-325 MG PO TABS: 1.0000 | ORAL_TABLET | Freq: Four times a day (QID) | ORAL | 0 refills | Status: AC | PRN

## 2018-04-15 MED ORDER — BUPIVACAINE HCL (PF) 0.5 % IJ SOLN
INTRAMUSCULAR | Status: DC | PRN
  Administered 2018-04-15: 10 mL

## 2018-04-15 MED ORDER — ONDANSETRON HCL 4 MG/2ML IJ SOLN: 4.0000 mg | Freq: Four times a day (QID) | INTRAMUSCULAR | Status: DC | PRN

## 2018-04-15 SURGICAL SUPPLY — 28 items
BNDG COHESIVE 4X5 TAN STRL (GAUZE/BANDAGES/DRESSINGS) ×3 IMPLANT
BNDG ELASTIC 2X5.8 VLCR STR LF (GAUZE/BANDAGES/DRESSINGS) ×3 IMPLANT
BNDG ESMARK 4X12 TAN STRL LF (GAUZE/BANDAGES/DRESSINGS) ×3 IMPLANT
CANISTER SUCT 1200ML W/VALVE (MISCELLANEOUS) ×3 IMPLANT
CHLORAPREP W/TINT 26ML (MISCELLANEOUS) ×3 IMPLANT
CORD BIP STRL DISP 12FT (MISCELLANEOUS) ×3 IMPLANT
COVER WAND RF STERILE (DRAPES) ×3 IMPLANT
CUFF TOURN 18 STER (MISCELLANEOUS) IMPLANT
DRAPE SURG 17X11 SM STRL (DRAPES) ×3 IMPLANT
FORCEPS JEWEL BIP 4-3/4 STR (INSTRUMENTS) ×3 IMPLANT
GAUZE PETRO XEROFOAM 1X8 (MISCELLANEOUS) ×3 IMPLANT
GAUZE SPONGE 4X4 12PLY STRL (GAUZE/BANDAGES/DRESSINGS) ×3 IMPLANT
GLOVE BIO SURGEON STRL SZ8 (GLOVE) ×3 IMPLANT
GLOVE INDICATOR 8.0 STRL GRN (GLOVE) ×3 IMPLANT
GOWN STRL REUS W/ TWL LRG LVL3 (GOWN DISPOSABLE) ×1 IMPLANT
GOWN STRL REUS W/ TWL XL LVL3 (GOWN DISPOSABLE) ×1 IMPLANT
GOWN STRL REUS W/TWL LRG LVL3 (GOWN DISPOSABLE) ×2
GOWN STRL REUS W/TWL XL LVL3 (GOWN DISPOSABLE) ×2
KIT CARPAL TUNNEL (MISCELLANEOUS) ×2
KIT ESCP INSRT D SLOT CANN KN (MISCELLANEOUS) ×1 IMPLANT
KIT TURNOVER KIT A (KITS) ×3 IMPLANT
NS IRRIG 500ML POUR BTL (IV SOLUTION) ×3 IMPLANT
PACK EXTREMITY ARMC (MISCELLANEOUS) ×3 IMPLANT
SPLINT WRIST LG LT TX990309 (SOFTGOODS) IMPLANT
SPLINT WRIST M LT TX990308 (SOFTGOODS) IMPLANT
SPLINT WRIST M RT TX990303 (SOFTGOODS) IMPLANT
STOCKINETTE IMPERVIOUS 9X36 MD (GAUZE/BANDAGES/DRESSINGS) ×3 IMPLANT
SUT PROLENE 4 0 PS 2 18 (SUTURE) ×3 IMPLANT

## 2018-04-15 NOTE — Anesthesia Post-op Follow-up Note (Signed)
Anesthesia QCDR form completed.        

## 2018-04-15 NOTE — Anesthesia Postprocedure Evaluation (Signed)
Anesthesia Post Note  Patient: Mary Hudson  Procedure(s) Performed: CARPAL TUNNEL RELEASE ENDOSCOPIC (Right Hand)  Patient location during evaluation: PACU Anesthesia Type: General Level of consciousness: awake and alert and oriented Pain management: pain level controlled Vital Signs Assessment: post-procedure vital signs reviewed and stable Respiratory status: spontaneous breathing, nonlabored ventilation and respiratory function stable Cardiovascular status: blood pressure returned to baseline and stable Postop Assessment: no signs of nausea or vomiting Anesthetic complications: no     Last Vitals:  Vitals:   04/15/18 0852 04/15/18 0913  BP: 118/78 122/73  Pulse: 74 72  Resp: 16   Temp: (!) 36.2 C   SpO2: 98% 99%    Last Pain:  Vitals:   04/15/18 0913  TempSrc:   PainSc: 0-No pain                 Jahn Franchini

## 2018-04-15 NOTE — Op Note (Signed)
04/15/2018  8:16 AM  Patient:   Mary Hudson  Pre-Op Diagnosis:   Right carpal tunnel syndrome.  Post-Op Diagnosis:   Same.  Procedure:   Endoscopic right carpal tunnel release.  Surgeon:   Pascal Lux, MD  Assistant:   Phoebe Sharps, PA-S  Anesthesia:   General LMA  Findings:   As above.  Complications:   None  EBL:   0 cc  Fluids:   600 cc crystalloid  TT:   12 minutes at 250 mmHg  Drains:   None  Closure:   4-0 Prolene interrupted sutures  Brief Clinical Note:   The patient is a 67 year old female with a long history of pain and paresthesias to her right hand. Her symptoms have persisted despite medications, activity modification, splinting, etc. Her history and examination are consistent with carpal tunnel syndrome. The patient presents at this time for an endoscopic right carpal tunnel release.   Procedure:   The patient was brought into the operating room and lain in the supine position. After adequate general laryngeal mask anesthesia was achieved, the right hand and upper extremity were prepped with ChloraPrep solution before being draped sterilely. Preoperative antibiotics were administered. A timeout was performed to verify the appropriate surgical site before the limb was exsanguinated with an Esmarch and the tourniquet inflated to 250 mmHg. An approximately 1.5-2 cm incision was made over the volar wrist flexion crease, centered over the palmaris longus tendon. The incision was carried down through the subcutaneous tissues with care taken to identify and protect any neurovascular structures. The distal forearm fascia was penetrated just proximal to the transverse carpal ligament. The soft tissues were released off the superficial and deep surfaces of the distal forearm fascia and this was released proximally for 3-4 cm under direct visualization.  Attention was directed distally. The Soil scientist was passed beneath the transverse carpal ligament along the  ulnar aspect of the carpal tunnel and used to release any adhesions as well as to remove any adherent synovial tissue before first the smaller then the larger of the two dilators were passed beneath the transverse carpal ligament along the ulnar margin of the carpal tunnel. The slotted cannula was introduced and the endoscope was placed into the slotted cannula and the undersurface of the transverse carpal ligament visualized. The distal margin of the transverse carpal ligament was marked by placing a 25-gauge needle percutaneously at [place] cardinal point so that it entered the distal portion of the slotted cannula. Under endoscopic visualization, the transverse carpal ligament was released from proximal to distal using the end-cutting blade. A second pass was performed to ensure complete release of the ligament. The adequacy of release was verified both endoscopically and by palpation using the freer elevator.  The wound was irrigated thoroughly with sterile saline solution before being closed using 4-0 Prolene interrupted sutures. A total of 10 cc of 0.5% plain Sensorcaine was injected in and around the incision before a sterile bulky dressing was applied to the wound. The patient was placed into a volar wrist splint before being awakened and returned to the recovery room in satisfactory condition after tolerating the procedure well.

## 2018-04-15 NOTE — Progress Notes (Signed)
   04/15/18 0700  Clinical Encounter Type  Visited With Patient;Family  Visit Type Initial;Spiritual support  Recommendations Follow-up as requested.  Spiritual Encounters  Spiritual Needs Emotional   Chaplain provided emotional support and presence.

## 2018-04-15 NOTE — Transfer of Care (Signed)
Immediate Anesthesia Transfer of Care Note  Patient: Mary Hudson  Procedure(s) Performed: CARPAL TUNNEL RELEASE ENDOSCOPIC (Right Hand)  Patient Location: PACU  Anesthesia Type:General  Level of Consciousness: awake  Airway & Oxygen Therapy: Patient Spontanous Breathing  Post-op Assessment: Report given to RN  Post vital signs: stable  Last Vitals:  Vitals Value Taken Time  BP    Temp    Pulse 82 04/15/2018  8:19 AM  Resp 16 04/15/2018  8:19 AM  SpO2 87 % 04/15/2018  8:19 AM  Vitals shown include unvalidated device data.  Last Pain:  Vitals:   04/15/18 0627  TempSrc: Temporal  PainSc: 0-No pain         Complications: No apparent anesthesia complications

## 2018-04-15 NOTE — H&P (Signed)
Paper H&P to be scanned into permanent record. H&P reviewed and patient re-examined. No changes. 

## 2018-04-15 NOTE — Anesthesia Preprocedure Evaluation (Signed)
Anesthesia Evaluation  Patient identified by MRN, date of birth, ID band Patient awake    Reviewed: Allergy & Precautions, NPO status , Patient's Chart, lab work & pertinent test results  History of Anesthesia Complications Negative for: history of anesthetic complications  Airway Mallampati: II  TM Distance: >3 FB Neck ROM: Full    Dental no notable dental hx.    Pulmonary neg sleep apnea, neg COPD,    breath sounds clear to auscultation- rhonchi (-) wheezing      Cardiovascular Exercise Tolerance: Good (-) hypertension(-) CAD, (-) Past MI, (-) Cardiac Stents and (-) CABG  Rhythm:Regular Rate:Normal - Systolic murmurs and - Diastolic murmurs    Neuro/Psych  Headaches, negative psych ROS   GI/Hepatic Neg liver ROS, GERD  ,  Endo/Other  negative endocrine ROSneg diabetes  Renal/GU negative Renal ROS     Musculoskeletal negative musculoskeletal ROS (+)   Abdominal (+) + obese,   Peds  Hematology negative hematology ROS (+)   Anesthesia Other Findings Past Medical History: No date: Chronic reflux esophagitis No date: Essential tremor No date: GERD (gastroesophageal reflux disease) No date: Headache     Comment:  Migraine with aura and without status migrainosus, not               intractable No date: Hyperlipidemia No date: Lumbar radiculitis No date: Meralgia paraesthetica, right   Reproductive/Obstetrics                             Anesthesia Physical Anesthesia Plan  ASA: II  Anesthesia Plan: General   Post-op Pain Management:    Induction: Intravenous  PONV Risk Score and Plan: 2 and Ondansetron, Dexamethasone and Midazolam  Airway Management Planned: LMA  Additional Equipment:   Intra-op Plan:   Post-operative Plan:   Informed Consent: I have reviewed the patients History and Physical, chart, labs and discussed the procedure including the risks, benefits and  alternatives for the proposed anesthesia with the patient or authorized representative who has indicated his/her understanding and acceptance.   Dental advisory given  Plan Discussed with: CRNA and Anesthesiologist  Anesthesia Plan Comments:         Anesthesia Quick Evaluation

## 2018-04-15 NOTE — Discharge Instructions (Addendum)
Orthopedic discharge instructions: Keep dressing dry and intact. Keep hand elevated above heart level. May shower after dressing removed on postop day 4 (Monday). Cover sutures with Band-Aids after drying off. Apply ice to affected area frequently. Take Lodine 400 mg BID OR ibuprofen 600-800 mg TID with meals for 7-10 days, then as necessary. Take ES Tylenol or pain medication as prescribed when needed.  Return for follow-up in 10-14 days or as scheduled.   AMBULATORY SURGERY  DISCHARGE INSTRUCTIONS   1) The drugs that you were given will stay in your system until tomorrow so for the next 24 hours you should not:  A) Drive an automobile B) Make any legal decisions C) Drink any alcoholic beverage   2) You may resume regular meals tomorrow.  Today it is better to start with liquids and gradually work up to solid foods.  You may eat anything you prefer, but it is better to start with liquids, then soup and crackers, and gradually work up to solid foods.   3) Please notify your doctor immediately if you have any unusual bleeding, trouble breathing, redness and pain at the surgery site, drainage, fever, or pain not relieved by medication.    4) Additional Instructions:        Please contact your physician with any problems or Same Day Surgery at [phone], Monday through Friday 6 am to 4 pm, or [place] at [facility] number at [phone].

## 2018-04-19 NOTE — Addendum Note (Signed)
Addendum  created 04/19/18 0817 by Doreen Salvage, CRNA   Charge Capture section accepted

## 2018-05-20 DIAGNOSIS — J0100 Acute maxillary sinusitis, unspecified: Secondary | ICD-10-CM | POA: Diagnosis not present

## 2018-07-23 DIAGNOSIS — Z79899 Other long term (current) drug therapy: Secondary | ICD-10-CM | POA: Diagnosis not present

## 2018-07-23 DIAGNOSIS — E782 Mixed hyperlipidemia: Secondary | ICD-10-CM | POA: Diagnosis not present

## 2018-07-30 DIAGNOSIS — E782 Mixed hyperlipidemia: Secondary | ICD-10-CM | POA: Diagnosis not present

## 2018-07-30 DIAGNOSIS — Z0000 Encounter for general adult medical examination without abnormal findings: Secondary | ICD-10-CM | POA: Diagnosis not present

## 2018-07-30 DIAGNOSIS — Z23 Encounter for immunization: Secondary | ICD-10-CM | POA: Diagnosis not present

## 2018-07-30 DIAGNOSIS — E039 Hypothyroidism, unspecified: Secondary | ICD-10-CM | POA: Diagnosis not present

## 2018-08-06 ENCOUNTER — Other Ambulatory Visit: Payer: Self-pay | Admitting: Internal Medicine

## 2018-08-06 DIAGNOSIS — Z1231 Encounter for screening mammogram for malignant neoplasm of breast: Secondary | ICD-10-CM

## 2018-09-02 DIAGNOSIS — E039 Hypothyroidism, unspecified: Secondary | ICD-10-CM | POA: Diagnosis not present

## 2018-09-09 DIAGNOSIS — E039 Hypothyroidism, unspecified: Secondary | ICD-10-CM | POA: Diagnosis not present

## 2018-09-09 DIAGNOSIS — Z1331 Encounter for screening for depression: Secondary | ICD-10-CM | POA: Diagnosis not present

## 2018-09-09 DIAGNOSIS — M65341 Trigger finger, right ring finger: Secondary | ICD-10-CM | POA: Diagnosis not present

## 2018-09-09 DIAGNOSIS — R5383 Other fatigue: Secondary | ICD-10-CM | POA: Diagnosis not present

## 2018-09-09 DIAGNOSIS — M67441 Ganglion, right hand: Secondary | ICD-10-CM | POA: Diagnosis not present

## 2018-09-09 DIAGNOSIS — M79644 Pain in right finger(s): Secondary | ICD-10-CM | POA: Diagnosis not present

## 2018-10-29 ENCOUNTER — Other Ambulatory Visit: Payer: Self-pay

## 2018-10-29 ENCOUNTER — Ambulatory Visit
Admission: RE | Admit: 2018-10-29 | Discharge: 2018-10-29 | Disposition: A | Discharge status: Home / Self Care | Payer: PPO | Source: Ambulatory Visit | Attending: Internal Medicine | Admitting: Internal Medicine

## 2018-10-29 DIAGNOSIS — Z1231 Encounter for screening mammogram for malignant neoplasm of breast: Secondary | ICD-10-CM

## 2018-11-23 DIAGNOSIS — L918 Other hypertrophic disorders of the skin: Secondary | ICD-10-CM | POA: Diagnosis not present

## 2018-11-23 DIAGNOSIS — L74510 Primary focal hyperhidrosis, axilla: Secondary | ICD-10-CM | POA: Diagnosis not present

## 2019-02-10 DIAGNOSIS — M67441 Ganglion, right hand: Secondary | ICD-10-CM | POA: Diagnosis not present

## 2019-02-10 DIAGNOSIS — M79644 Pain in right finger(s): Secondary | ICD-10-CM | POA: Diagnosis not present

## 2019-02-10 DIAGNOSIS — Z9889 Other specified postprocedural states: Secondary | ICD-10-CM | POA: Diagnosis not present

## 2019-02-10 DIAGNOSIS — M65341 Trigger finger, right ring finger: Secondary | ICD-10-CM | POA: Diagnosis not present

## 2019-03-01 DIAGNOSIS — H40153 Residual stage of open-angle glaucoma, bilateral: Secondary | ICD-10-CM | POA: Diagnosis not present

## 2019-03-02 DIAGNOSIS — M65341 Trigger finger, right ring finger: Secondary | ICD-10-CM | POA: Diagnosis not present

## 2019-03-08 DIAGNOSIS — E039 Hypothyroidism, unspecified: Secondary | ICD-10-CM | POA: Diagnosis not present

## 2019-03-15 DIAGNOSIS — G43109 Migraine with aura, not intractable, without status migrainosus: Secondary | ICD-10-CM | POA: Diagnosis not present

## 2019-03-15 DIAGNOSIS — E782 Mixed hyperlipidemia: Secondary | ICD-10-CM | POA: Diagnosis not present

## 2019-03-15 DIAGNOSIS — E039 Hypothyroidism, unspecified: Secondary | ICD-10-CM | POA: Diagnosis not present

## 2019-05-31 DIAGNOSIS — L74510 Primary focal hyperhidrosis, axilla: Secondary | ICD-10-CM | POA: Diagnosis not present

## 2019-05-31 DIAGNOSIS — L578 Other skin changes due to chronic exposure to nonionizing radiation: Secondary | ICD-10-CM | POA: Diagnosis not present

## 2019-06-10 DIAGNOSIS — R05 Cough: Secondary | ICD-10-CM | POA: Diagnosis not present

## 2019-07-26 DIAGNOSIS — E039 Hypothyroidism, unspecified: Secondary | ICD-10-CM | POA: Diagnosis not present

## 2019-07-26 DIAGNOSIS — E782 Mixed hyperlipidemia: Secondary | ICD-10-CM | POA: Diagnosis not present

## 2019-08-02 DIAGNOSIS — E039 Hypothyroidism, unspecified: Secondary | ICD-10-CM | POA: Diagnosis not present

## 2019-08-02 DIAGNOSIS — F330 Major depressive disorder, recurrent, mild: Secondary | ICD-10-CM | POA: Diagnosis not present

## 2019-08-02 DIAGNOSIS — E782 Mixed hyperlipidemia: Secondary | ICD-10-CM | POA: Diagnosis not present

## 2019-08-02 DIAGNOSIS — Z0000 Encounter for general adult medical examination without abnormal findings: Secondary | ICD-10-CM | POA: Diagnosis not present

## 2019-09-13 DIAGNOSIS — M5116 Intervertebral disc disorders with radiculopathy, lumbar region: Secondary | ICD-10-CM | POA: Diagnosis not present

## 2019-09-13 DIAGNOSIS — H9201 Otalgia, right ear: Secondary | ICD-10-CM | POA: Diagnosis not present

## 2019-11-04 ENCOUNTER — Other Ambulatory Visit: Payer: Self-pay | Admitting: Internal Medicine

## 2019-11-04 DIAGNOSIS — Z1231 Encounter for screening mammogram for malignant neoplasm of breast: Secondary | ICD-10-CM

## 2019-11-09 ENCOUNTER — Ambulatory Visit
Admission: RE | Admit: 2019-11-09 | Discharge: 2019-11-09 | Disposition: A | Discharge status: Home / Self Care | Payer: PPO | Source: Ambulatory Visit | Attending: Internal Medicine | Admitting: Internal Medicine

## 2019-11-09 DIAGNOSIS — Z1231 Encounter for screening mammogram for malignant neoplasm of breast: Secondary | ICD-10-CM

## 2020-06-14 DIAGNOSIS — L821 Other seborrheic keratosis: Secondary | ICD-10-CM | POA: Diagnosis not present

## 2020-06-14 DIAGNOSIS — L918 Other hypertrophic disorders of the skin: Secondary | ICD-10-CM | POA: Diagnosis not present

## 2020-06-14 DIAGNOSIS — L578 Other skin changes due to chronic exposure to nonionizing radiation: Secondary | ICD-10-CM | POA: Diagnosis not present

## 2020-06-14 DIAGNOSIS — D225 Melanocytic nevi of trunk: Secondary | ICD-10-CM | POA: Diagnosis not present

## 2020-06-14 DIAGNOSIS — L738 Other specified follicular disorders: Secondary | ICD-10-CM | POA: Diagnosis not present

## 2020-07-26 DIAGNOSIS — E782 Mixed hyperlipidemia: Secondary | ICD-10-CM | POA: Diagnosis not present

## 2020-08-02 DIAGNOSIS — F330 Major depressive disorder, recurrent, mild: Secondary | ICD-10-CM | POA: Diagnosis not present

## 2020-08-02 DIAGNOSIS — Z0000 Encounter for general adult medical examination without abnormal findings: Secondary | ICD-10-CM | POA: Diagnosis not present

## 2020-08-02 DIAGNOSIS — E782 Mixed hyperlipidemia: Secondary | ICD-10-CM | POA: Diagnosis not present

## 2020-10-08 ENCOUNTER — Other Ambulatory Visit: Payer: Self-pay | Admitting: Internal Medicine

## 2020-10-08 DIAGNOSIS — Z1231 Encounter for screening mammogram for malignant neoplasm of breast: Secondary | ICD-10-CM

## 2020-10-25 DIAGNOSIS — N3090 Cystitis, unspecified without hematuria: Secondary | ICD-10-CM | POA: Diagnosis not present

## 2020-10-25 DIAGNOSIS — J4520 Mild intermittent asthma, uncomplicated: Secondary | ICD-10-CM | POA: Diagnosis not present

## 2020-11-13 ENCOUNTER — Ambulatory Visit
Admission: RE | Admit: 2020-11-13 | Discharge: 2020-11-13 | Disposition: A | Discharge status: Home / Self Care | Payer: PPO | Source: Ambulatory Visit | Attending: Internal Medicine | Admitting: Internal Medicine

## 2020-11-13 ENCOUNTER — Other Ambulatory Visit: Payer: Self-pay

## 2020-11-13 DIAGNOSIS — Z1231 Encounter for screening mammogram for malignant neoplasm of breast: Secondary | ICD-10-CM | POA: Insufficient documentation

## 2021-03-07 DIAGNOSIS — M7061 Trochanteric bursitis, right hip: Secondary | ICD-10-CM | POA: Diagnosis not present

## 2021-03-07 DIAGNOSIS — Z23 Encounter for immunization: Secondary | ICD-10-CM | POA: Diagnosis not present

## 2021-06-18 DIAGNOSIS — R61 Generalized hyperhidrosis: Secondary | ICD-10-CM | POA: Diagnosis not present

## 2021-06-18 DIAGNOSIS — D225 Melanocytic nevi of trunk: Secondary | ICD-10-CM | POA: Diagnosis not present

## 2021-06-18 DIAGNOSIS — L578 Other skin changes due to chronic exposure to nonionizing radiation: Secondary | ICD-10-CM | POA: Diagnosis not present

## 2021-06-18 DIAGNOSIS — L821 Other seborrheic keratosis: Secondary | ICD-10-CM | POA: Diagnosis not present

## 2021-06-18 DIAGNOSIS — L720 Epidermal cyst: Secondary | ICD-10-CM | POA: Diagnosis not present

## 2021-07-30 DIAGNOSIS — E782 Mixed hyperlipidemia: Secondary | ICD-10-CM | POA: Diagnosis not present

## 2021-08-06 DIAGNOSIS — E039 Hypothyroidism, unspecified: Secondary | ICD-10-CM | POA: Diagnosis not present

## 2021-08-06 DIAGNOSIS — Z0000 Encounter for general adult medical examination without abnormal findings: Secondary | ICD-10-CM | POA: Diagnosis not present

## 2021-08-06 DIAGNOSIS — F330 Major depressive disorder, recurrent, mild: Secondary | ICD-10-CM | POA: Diagnosis not present

## 2021-08-06 DIAGNOSIS — E782 Mixed hyperlipidemia: Secondary | ICD-10-CM | POA: Diagnosis not present

## 2021-08-06 DIAGNOSIS — E559 Vitamin D deficiency, unspecified: Secondary | ICD-10-CM | POA: Diagnosis not present

## 2021-08-08 DIAGNOSIS — R61 Generalized hyperhidrosis: Secondary | ICD-10-CM | POA: Diagnosis not present

## 2021-08-08 DIAGNOSIS — E039 Hypothyroidism, unspecified: Secondary | ICD-10-CM | POA: Diagnosis not present

## 2021-08-08 DIAGNOSIS — J302 Other seasonal allergic rhinitis: Secondary | ICD-10-CM | POA: Diagnosis not present

## 2021-08-08 DIAGNOSIS — E6609 Other obesity due to excess calories: Secondary | ICD-10-CM | POA: Diagnosis not present

## 2021-08-08 DIAGNOSIS — M1990 Unspecified osteoarthritis, unspecified site: Secondary | ICD-10-CM | POA: Diagnosis not present

## 2021-08-12 DIAGNOSIS — E6609 Other obesity due to excess calories: Secondary | ICD-10-CM | POA: Diagnosis not present

## 2021-08-12 DIAGNOSIS — R61 Generalized hyperhidrosis: Secondary | ICD-10-CM | POA: Diagnosis not present

## 2021-08-12 DIAGNOSIS — E039 Hypothyroidism, unspecified: Secondary | ICD-10-CM | POA: Diagnosis not present

## 2021-08-22 DIAGNOSIS — N951 Menopausal and female climacteric states: Secondary | ICD-10-CM | POA: Diagnosis not present

## 2021-08-22 DIAGNOSIS — E039 Hypothyroidism, unspecified: Secondary | ICD-10-CM | POA: Diagnosis not present

## 2021-08-22 DIAGNOSIS — E6609 Other obesity due to excess calories: Secondary | ICD-10-CM | POA: Diagnosis not present

## 2021-08-22 DIAGNOSIS — M1990 Unspecified osteoarthritis, unspecified site: Secondary | ICD-10-CM | POA: Diagnosis not present

## 2021-08-22 DIAGNOSIS — R61 Generalized hyperhidrosis: Secondary | ICD-10-CM | POA: Diagnosis not present

## 2021-10-29 ENCOUNTER — Other Ambulatory Visit: Payer: Self-pay | Admitting: Internal Medicine

## 2021-10-29 DIAGNOSIS — Z1231 Encounter for screening mammogram for malignant neoplasm of breast: Secondary | ICD-10-CM

## 2021-11-18 DIAGNOSIS — H26493 Other secondary cataract, bilateral: Secondary | ICD-10-CM | POA: Diagnosis not present

## 2021-11-18 DIAGNOSIS — H43812 Vitreous degeneration, left eye: Secondary | ICD-10-CM | POA: Diagnosis not present

## 2021-11-21 ENCOUNTER — Ambulatory Visit
Admission: RE | Admit: 2021-11-21 | Discharge: 2021-11-21 | Disposition: A | Discharge status: Home / Self Care | Payer: PPO | Source: Ambulatory Visit | Attending: Internal Medicine | Admitting: Internal Medicine

## 2021-11-21 DIAGNOSIS — Z1231 Encounter for screening mammogram for malignant neoplasm of breast: Secondary | ICD-10-CM | POA: Diagnosis not present

## 2021-11-25 DIAGNOSIS — H26492 Other secondary cataract, left eye: Secondary | ICD-10-CM | POA: Diagnosis not present

## 2021-11-25 DIAGNOSIS — H26493 Other secondary cataract, bilateral: Secondary | ICD-10-CM | POA: Diagnosis not present

## 2021-12-16 DIAGNOSIS — H26491 Other secondary cataract, right eye: Secondary | ICD-10-CM | POA: Diagnosis not present

## 2022-06-19 DIAGNOSIS — D225 Melanocytic nevi of trunk: Secondary | ICD-10-CM | POA: Diagnosis not present

## 2022-06-19 DIAGNOSIS — L578 Other skin changes due to chronic exposure to nonionizing radiation: Secondary | ICD-10-CM | POA: Diagnosis not present

## 2022-06-19 DIAGNOSIS — L74519 Primary focal hyperhidrosis, unspecified: Secondary | ICD-10-CM | POA: Diagnosis not present

## 2022-06-19 DIAGNOSIS — L300 Nummular dermatitis: Secondary | ICD-10-CM | POA: Diagnosis not present

## 2022-08-05 DIAGNOSIS — F330 Major depressive disorder, recurrent, mild: Secondary | ICD-10-CM | POA: Diagnosis not present

## 2022-08-05 DIAGNOSIS — E782 Mixed hyperlipidemia: Secondary | ICD-10-CM | POA: Diagnosis not present

## 2022-08-05 DIAGNOSIS — E559 Vitamin D deficiency, unspecified: Secondary | ICD-10-CM | POA: Diagnosis not present

## 2022-08-12 DIAGNOSIS — F330 Major depressive disorder, recurrent, mild: Secondary | ICD-10-CM | POA: Diagnosis not present

## 2022-08-12 DIAGNOSIS — K2100 Gastro-esophageal reflux disease with esophagitis, without bleeding: Secondary | ICD-10-CM | POA: Diagnosis not present

## 2022-08-12 DIAGNOSIS — Z0000 Encounter for general adult medical examination without abnormal findings: Secondary | ICD-10-CM | POA: Diagnosis not present

## 2022-08-12 DIAGNOSIS — Z79899 Other long term (current) drug therapy: Secondary | ICD-10-CM | POA: Diagnosis not present

## 2022-08-12 DIAGNOSIS — E782 Mixed hyperlipidemia: Secondary | ICD-10-CM | POA: Diagnosis not present

## 2022-10-27 DIAGNOSIS — H43392 Other vitreous opacities, left eye: Secondary | ICD-10-CM | POA: Diagnosis not present

## 2022-10-27 DIAGNOSIS — Z961 Presence of intraocular lens: Secondary | ICD-10-CM | POA: Diagnosis not present

## 2022-10-27 DIAGNOSIS — H18413 Arcus senilis, bilateral: Secondary | ICD-10-CM | POA: Diagnosis not present

## 2022-11-03 DIAGNOSIS — H43812 Vitreous degeneration, left eye: Secondary | ICD-10-CM | POA: Diagnosis not present

## 2022-11-04 ENCOUNTER — Other Ambulatory Visit: Payer: Self-pay

## 2022-11-04 DIAGNOSIS — Z1231 Encounter for screening mammogram for malignant neoplasm of breast: Secondary | ICD-10-CM

## 2022-11-25 ENCOUNTER — Ambulatory Visit
Admission: RE | Admit: 2022-11-25 | Discharge: 2022-11-25 | Disposition: A | Discharge status: Home / Self Care | Payer: PPO | Source: Ambulatory Visit | Attending: Internal Medicine | Admitting: Internal Medicine

## 2022-11-25 DIAGNOSIS — Z1231 Encounter for screening mammogram for malignant neoplasm of breast: Secondary | ICD-10-CM | POA: Insufficient documentation

## 2023-03-24 DIAGNOSIS — M25531 Pain in right wrist: Secondary | ICD-10-CM | POA: Diagnosis not present

## 2023-03-24 DIAGNOSIS — Z23 Encounter for immunization: Secondary | ICD-10-CM | POA: Diagnosis not present

## 2023-06-22 DIAGNOSIS — J019 Acute sinusitis, unspecified: Secondary | ICD-10-CM | POA: Diagnosis not present

## 2023-06-23 DIAGNOSIS — L74510 Primary focal hyperhidrosis, axilla: Secondary | ICD-10-CM | POA: Diagnosis not present

## 2023-06-23 DIAGNOSIS — L578 Other skin changes due to chronic exposure to nonionizing radiation: Secondary | ICD-10-CM | POA: Diagnosis not present

## 2023-06-23 DIAGNOSIS — L300 Nummular dermatitis: Secondary | ICD-10-CM | POA: Diagnosis not present

## 2023-08-06 DIAGNOSIS — R7309 Other abnormal glucose: Secondary | ICD-10-CM | POA: Diagnosis not present

## 2023-08-06 DIAGNOSIS — Z79899 Other long term (current) drug therapy: Secondary | ICD-10-CM | POA: Diagnosis not present

## 2023-08-06 DIAGNOSIS — E782 Mixed hyperlipidemia: Secondary | ICD-10-CM | POA: Diagnosis not present

## 2023-08-13 DIAGNOSIS — Z0000 Encounter for general adult medical examination without abnormal findings: Secondary | ICD-10-CM | POA: Diagnosis not present

## 2023-08-13 DIAGNOSIS — F339 Major depressive disorder, recurrent, unspecified: Secondary | ICD-10-CM | POA: Diagnosis not present

## 2023-08-13 DIAGNOSIS — R739 Hyperglycemia, unspecified: Secondary | ICD-10-CM | POA: Diagnosis not present

## 2023-08-13 DIAGNOSIS — E782 Mixed hyperlipidemia: Secondary | ICD-10-CM | POA: Diagnosis not present

## 2023-08-13 DIAGNOSIS — F330 Major depressive disorder, recurrent, mild: Secondary | ICD-10-CM | POA: Diagnosis not present

## 2023-10-26 ENCOUNTER — Other Ambulatory Visit: Payer: Self-pay | Admitting: Internal Medicine

## 2023-10-26 DIAGNOSIS — Z1231 Encounter for screening mammogram for malignant neoplasm of breast: Secondary | ICD-10-CM

## 2023-11-26 ENCOUNTER — Ambulatory Visit
Admission: RE | Admit: 2023-11-26 | Discharge: 2023-11-26 | Disposition: A | Discharge status: Home / Self Care | Source: Ambulatory Visit | Attending: Internal Medicine | Admitting: Internal Medicine

## 2023-11-26 DIAGNOSIS — Z1231 Encounter for screening mammogram for malignant neoplasm of breast: Secondary | ICD-10-CM | POA: Insufficient documentation

## 2023-12-23 DIAGNOSIS — M25561 Pain in right knee: Secondary | ICD-10-CM | POA: Diagnosis not present

## 2024-01-07 DIAGNOSIS — M1711 Unilateral primary osteoarthritis, right knee: Secondary | ICD-10-CM | POA: Diagnosis not present
